# Patient Record
Sex: Female | Born: 1995 | Race: White | Hispanic: No | Marital: Single | Smoking: Former smoker
Health system: Southern US, Community
[De-identification: ages and names within clinical notes are randomized; demographics above are authoritative.]

## PROBLEM LIST (undated history)

## (undated) DIAGNOSIS — F419 Anxiety disorder, unspecified: Secondary | ICD-10-CM

## (undated) DIAGNOSIS — F4310 Post-traumatic stress disorder, unspecified: Secondary | ICD-10-CM

## (undated) DIAGNOSIS — F909 Attention-deficit hyperactivity disorder, unspecified type: Secondary | ICD-10-CM

## (undated) DIAGNOSIS — F319 Bipolar disorder, unspecified: Secondary | ICD-10-CM

## (undated) DIAGNOSIS — F329 Major depressive disorder, single episode, unspecified: Secondary | ICD-10-CM

## (undated) DIAGNOSIS — F32A Depression, unspecified: Secondary | ICD-10-CM

---
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
## 2018-11-25 ENCOUNTER — Inpatient Hospital Stay (HOSPITAL_COMMUNITY)
Admission: AD | Admit: 2018-11-25 | Discharge: 2018-11-26 | Disposition: A | Discharge status: Home / Self Care | Payer: Self-pay | Attending: Obstetrics & Gynecology | Admitting: Obstetrics & Gynecology

## 2018-11-25 ENCOUNTER — Other Ambulatory Visit: Payer: Self-pay

## 2018-11-25 DIAGNOSIS — R1031 Right lower quadrant pain: Secondary | ICD-10-CM | POA: Insufficient documentation

## 2018-11-25 DIAGNOSIS — O99341 Other mental disorders complicating pregnancy, first trimester: Secondary | ICD-10-CM | POA: Insufficient documentation

## 2018-11-25 DIAGNOSIS — O3680X0 Pregnancy with inconclusive fetal viability, not applicable or unspecified: Secondary | ICD-10-CM | POA: Insufficient documentation

## 2018-11-25 DIAGNOSIS — F4310 Post-traumatic stress disorder, unspecified: Secondary | ICD-10-CM | POA: Insufficient documentation

## 2018-11-25 DIAGNOSIS — R1032 Left lower quadrant pain: Secondary | ICD-10-CM | POA: Insufficient documentation

## 2018-11-25 DIAGNOSIS — Z87891 Personal history of nicotine dependence: Secondary | ICD-10-CM | POA: Insufficient documentation

## 2018-11-25 DIAGNOSIS — R109 Unspecified abdominal pain: Secondary | ICD-10-CM | POA: Insufficient documentation

## 2018-11-25 DIAGNOSIS — R1012 Left upper quadrant pain: Secondary | ICD-10-CM | POA: Insufficient documentation

## 2018-11-25 DIAGNOSIS — R1011 Right upper quadrant pain: Secondary | ICD-10-CM | POA: Insufficient documentation

## 2018-11-25 DIAGNOSIS — Z3A01 Less than 8 weeks gestation of pregnancy: Secondary | ICD-10-CM | POA: Insufficient documentation

## 2018-11-25 DIAGNOSIS — O26891 Other specified pregnancy related conditions, first trimester: Secondary | ICD-10-CM | POA: Insufficient documentation

## 2018-11-25 HISTORY — DX: Anxiety disorder, unspecified: F41.9

## 2018-11-25 HISTORY — DX: Post-traumatic stress disorder, unspecified: F43.10

## 2018-11-25 HISTORY — DX: Bipolar disorder, unspecified: F31.9

## 2018-11-25 HISTORY — DX: Depression, unspecified: F32.A

## 2018-11-25 HISTORY — DX: Attention-deficit hyperactivity disorder, unspecified type: F90.9

## 2018-11-25 LAB — URINALYSIS, ROUTINE W REFLEX MICROSCOPIC
Bacteria, UA: NONE SEEN
Bilirubin Urine: NEGATIVE
Glucose, UA: NEGATIVE mg/dL
Hgb urine dipstick: NEGATIVE
Ketones, ur: NEGATIVE mg/dL
Nitrite: NEGATIVE
Protein, ur: NEGATIVE mg/dL
Specific Gravity, Urine: 1.029 (ref 1.005–1.030)
pH: 5 (ref 5.0–8.0)

## 2018-11-25 LAB — POCT PREGNANCY, URINE: Preg Test, Ur: POSITIVE — AB

## 2018-11-25 NOTE — MAU Note (Addendum)
Pt reports abdominal pain and nausea. The abdominal pain began 5/16, it went away, reappeared 5/21 and then has been intermittent until tonight, 11/25/18 at 9:30 pm. Pt reports it began lower abdomen and spread all over. It feels like cramping at the least and a contraction at its worst she states. Currently the pain is lower abdomen, cramping, 3/10. At its worst it was 7/10. The pain resided on its own. Nausea started with the pain. Denies vomiting, bleeding, taking medications (other than prenatal, which was taken this am), visual disturbances.  Pt reports having white, thick discharge that has been consistent "since the beginning of pregnancy" LMP 10/09/18 approximately. Reports fatigue, dizziness was worse when this began, and HA.  HA- is everyday for pt she reports and has stayed the same, dull, posterior head, denies taking any medications for that. Pt reports having sexual intercourse this morning.

## 2018-11-26 ENCOUNTER — Inpatient Hospital Stay (HOSPITAL_COMMUNITY): Payer: Self-pay

## 2018-11-26 ENCOUNTER — Encounter (HOSPITAL_COMMUNITY): Payer: Self-pay | Admitting: Nurse Practitioner

## 2018-11-26 ENCOUNTER — Other Ambulatory Visit: Payer: Self-pay

## 2018-11-26 LAB — COMPREHENSIVE METABOLIC PANEL
ALT: 14 U/L (ref 0–44)
AST: 16 U/L (ref 15–41)
Albumin: 3.8 g/dL (ref 3.5–5.0)
Alkaline Phosphatase: 80 U/L (ref 38–126)
Anion gap: 6 (ref 5–15)
BUN: 11 mg/dL (ref 6–20)
CO2: 24 mmol/L (ref 22–32)
Calcium: 8.8 mg/dL — ABNORMAL LOW (ref 8.9–10.3)
Chloride: 107 mmol/L (ref 98–111)
Creatinine, Ser: 0.63 mg/dL (ref 0.44–1.00)
GFR calc Af Amer: >60 mL/min (ref >60)
GFR calc non Af Amer: >60 mL/min (ref >60)
Glucose, Bld: 90 mg/dL (ref 70–99)
Potassium: 3.8 mmol/L (ref 3.5–5.1)
Sodium: 137 mmol/L (ref 135–145)
Total Bilirubin: 0.5 mg/dL (ref 0.3–1.2)
Total Protein: 6.3 g/dL — ABNORMAL LOW (ref 6.5–8.1)

## 2018-11-26 LAB — WET PREP, GENITAL
Sperm: NONE SEEN
Trich, Wet Prep: NONE SEEN
Yeast Wet Prep HPF POC: NONE SEEN

## 2018-11-26 LAB — CBC
HCT: 36.9 % (ref 36.0–46.0)
Hemoglobin: 12.5 g/dL (ref 12.0–15.0)
MCH: 29.1 pg (ref 26.0–34.0)
MCHC: 33.9 g/dL (ref 30.0–36.0)
MCV: 85.8 fL (ref 80.0–100.0)
Platelets: 271 10*3/uL (ref 150–400)
RBC: 4.3 MIL/uL (ref 3.87–5.11)
RDW: 12 % (ref 11.5–15.5)
WBC: 10.8 10*3/uL — ABNORMAL HIGH (ref 4.0–10.5)
nRBC: 0 % (ref 0.0–0.2)

## 2018-11-26 LAB — ABO/RH: ABO/RH(D): A POS

## 2018-11-26 LAB — HCG, QUANTITATIVE, PREGNANCY: hCG, Beta Chain, Quant, S: 4446 m[IU]/mL — ABNORMAL HIGH (ref <5)

## 2018-11-26 MED ORDER — ONDANSETRON 8 MG PO TBDP: 8.0000 mg | ORAL_TABLET | Freq: Three times a day (TID) | ORAL | 0 refills | Status: AC | PRN

## 2018-11-26 MED ORDER — ONDANSETRON HCL 8 MG PO TABS: 8.0000 mg | ORAL_TABLET | Freq: Three times a day (TID) | ORAL | 0 refills | Status: AC | PRN

## 2018-11-26 MED ORDER — METRONIDAZOLE 500 MG PO TABS: 500.0000 mg | ORAL_TABLET | Freq: Two times a day (BID) | ORAL | 0 refills | Status: AC

## 2018-11-26 NOTE — Discharge Instructions (Signed)
-  come to clinic at [facility], 2nd Floor at 9 am on Wednesday May 27 for a repeat lab drawn  Ectopic Pregnancy  An ectopic pregnancy happens when a fertilized egg grows outside the womb (uterus). The fertilized egg cannot stay alive outside of the womb. This problem often happens in a fallopian tube. It is often caused by damage to the tube. If this problem is found early, you may be treated with medicine that stops the egg from growing. If your tube tears or bursts open (ruptures), you will bleed inside. Often, there is very bad pain in the lower belly. This is an emergency. You will need surgery. Get help right away. Follow these instructions at home: After being treated with medicine or surgery:  Rest and limit your activity for as long as told by your doctor.  Until your doctor says that it is safe: ? Do not lift anything that is heavier than 10 lb (4.5 kg) or the limit that your doctor tells you. ? Avoid exercise and any movement that takes a lot of effort.  To prevent problems when pooping (constipation): ? Eat a healthy diet. This includes:  Fruits.  Vegetables.  Whole grains. ? Drink 6-8 glasses of water a day. Contact a doctor if: Get help right away if:  You have sudden and very bad pain in your belly.  You have very bad pain in your shoulders or neck.  You have pain that gets worse and is not helped by medicine.  You have: ? A fever or chills. ? Vaginal bleeding. ? Redness or swelling at the site of a surgical cut (incision).  You feel sick to your stomach (nauseous) or you throw up (vomit).  You feel dizzy or weak.  You feel light-headed or you pass out (faint). Summary  An ectopic pregnancy happens when a fertilized egg grows outside the womb (uterus).  If this problem is found early, you may be treated with medicine that stops the egg from growing.  If your tube tears or bursts open (ruptures), you will need surgery. This is an emergency. Get help  right away. This information is not intended to replace advice given to you by your health care provider. Make sure you discuss any questions you have with your health care provider. Document Released: 09/16/2008 Document Revised: 07/14/2016 Document Reviewed: 07/14/2016 Elsevier Interactive Patient Education  2019 [employer].

## 2018-11-26 NOTE — MAU Provider Note (Signed)
Patient Betty Hudson is a 23 y.o. G5P1 at 7 weeks by certain LMP, however she has a history of irregular periods.  She denies vomiting, low back pain, VB, abnormal discharge. She says she has felt nauseated. She thinks she had a UTI around the 5th -11 of May; she says that the symptoms went away after taking Azo. She denies symptoms of UTI now.  Patient has a history of constipation; says that she is "always" constipated however she did have a BM today.  History     CSN: [id number]  Arrival date and time: 11/25/18 2310   None     Chief Complaint  Patient presents with  . Abdominal Pain   Abdominal Pain  This is a new problem. The current episode started today. The onset quality is sudden. The pain is located in the suprapubic region. The pain is at a severity of 6/10. The quality of the pain is cramping. The abdominal pain radiates to the RLQ, LUQ, RUQ and LLQ. Pertinent negatives include no constipation, diarrhea, dysuria, nausea or vomiting.  She has tried nothing for the symptoms. Nothing makes the pain better or worse.   OB History    Gravida  4   Para  1   Term      Preterm      AB  2   Living  1     SAB  2   TAB      Ectopic      Multiple      Live Births              Past Medical History:  Diagnosis Date  . ADHD   . Anxiety   . Bipolar 1 disorder (HCC)   . Depression   . PTSD (post-traumatic stress disorder)     History reviewed. No pertinent surgical history.  Family History  Problem Relation Age of Onset  . Heart disease Mother   . Obesity Mother   . Thyroid disease Mother   . Obesity Father   . Obesity Paternal Aunt   . Obesity Paternal Uncle   . Cancer Paternal Grandmother        Breast  . Obesity Paternal Grandmother   . Obesity Paternal Grandfather     Social History   Tobacco Use  . Smoking status: Former Smoker    Last attempt to quit: 11/20/2018    Years since quitting: 0.0  . Smokeless tobacco: Never Used  Substance Use  Topics  . Alcohol use: Never    Frequency: Never  . Drug use: Never    Allergies:  Allergies  Allergen Reactions  . Macrobid [Nitrofurantoin] Hives    No medications prior to admission.    Review of Systems  Constitutional: Negative.   HENT: Negative.   Respiratory: Negative.   Gastrointestinal: Positive for abdominal pain. Negative for constipation, diarrhea, nausea and vomiting.  Genitourinary: Negative for dysuria.  Musculoskeletal: Negative.   Neurological: Negative.   Psychiatric/Behavioral: Negative.    Physical Exam   Blood pressure 110/67, pulse 74, temperature 98.6 F (37 C), temperature source Oral, resp. rate 16, height 5\' 5"  (1.651 m), weight 75.3 kg, last menstrual period 10/09/2018, SpO2 99 %.  Physical Exam  Constitutional: She is oriented to person, place, and time. She appears well-developed and well-nourished.  HENT:  Head: Normocephalic.  Neck: Normal range of motion.  Respiratory: Effort normal.  GI: Soft.  Genitourinary:    Vagina normal.     Genitourinary Comments: NEFG; cervix is  long, closed, no CMT, suprapubic or adnexal tenderness.    Neurological: She is alert and oriented to person, place, and time.  Skin: Skin is warm and dry.  Psychiatric: She has a normal mood and affect.  Abdomen is soft, non-tender, low suspicion for surgical abdomen.   MAU Course  Procedures  MDM -complete ectopic work up performed.  -US shows IUGS but no yolk sac; beta is 4446. Technically pregnancy of unknown location.  -wet prep shows BV; patient now admitting to strong odor and discomfort, especially after intercourse.  -UA normal, no signs of infection.   Assessment and Plan   1. Pregnancy of unknown anatomic location   2. Abdominal pain    2. Try heat, tylenol, rest. Take Flagyl for BV; RX for Zofran tablets and pills given. Patient does not yet have Pregnancy medicaid; she will fill whichever one is cheaper and will use [employer] to cost compare. She  says she will pick up stool softener on her own as she knows that she may have worsening constipation with Zofran.   3. Strict ectopic precautions reviewed; appt made for Wednesday at 9 am for follow up stat [facility]. Lab order placed.   4. Reviewed address of clinic and protocol (wait for results, no visitors, wear a mask).  Charlesetta GaribaldiKathryn Lorraine Kynsli Haapala 11/26/2018, 3:27 AM

## 2018-11-27 LAB — GC/CHLAMYDIA PROBE AMP (~~LOC~~) NOT AT ARMC
Chlamydia: POSITIVE — AB
Neisseria Gonorrhea: NEGATIVE

## 2018-11-28 ENCOUNTER — Other Ambulatory Visit: Payer: Self-pay

## 2018-11-28 ENCOUNTER — Ambulatory Visit (INDEPENDENT_AMBULATORY_CARE_PROVIDER_SITE_OTHER): Payer: Self-pay | Admitting: General Practice

## 2018-11-28 ENCOUNTER — Telehealth: Payer: Self-pay | Admitting: General Practice

## 2018-11-28 DIAGNOSIS — O3680X0 Pregnancy with inconclusive fetal viability, not applicable or unspecified: Secondary | ICD-10-CM

## 2018-11-28 LAB — BETA HCG QUANT (REF LAB): hCG Quant: 5213 m[IU]/mL

## 2018-11-28 NOTE — Progress Notes (Signed)
Patient presents to office today for stat bhcg. Patient reports mild menstrual like cramps, but much less than when she was in MAU. Patient also reports an episode of spotting yesterday. Discussed with patient we are monitoring your bhcg levels today and that I would call her within a couple hours with results/updated plan of care. Patient verbalized understanding, left callback number [phone], and had no questions at this time.   Reviewed results with Dr Shawnie Pons who finds slight increase in bhcg levels but not doubled. Patient should return Friday for repeat bhcg for follow up eval. Will call patient with results.  Chase Caller RN BSN  11/28/18

## 2018-11-28 NOTE — Progress Notes (Signed)
Patient seen and assessed by nursing staff.  Agree with documentation and plan.  

## 2018-11-28 NOTE — Telephone Encounter (Signed)
Called patient with bhcg results and discussed recommended follow up on Friday for repeat bhcg. Ectopic precautions reviewed. Patient verbalized understanding & states she will come Friday at 830. Patient had no questions.

## 2018-11-30 ENCOUNTER — Other Ambulatory Visit: Payer: Self-pay

## 2018-11-30 ENCOUNTER — Ambulatory Visit (INDEPENDENT_AMBULATORY_CARE_PROVIDER_SITE_OTHER): Payer: Self-pay | Admitting: *Deleted

## 2018-11-30 ENCOUNTER — Ambulatory Visit: Payer: Self-pay

## 2018-11-30 DIAGNOSIS — O3680X0 Pregnancy with inconclusive fetal viability, not applicable or unspecified: Secondary | ICD-10-CM

## 2018-11-30 NOTE — Progress Notes (Signed)
Per chart review w/Dr. Alysia Penna, non-stat BHCG done today. Pt was advised she will be notified of lab result via MyChart. She denies having abdominal/pelvic pain or vaginal bleeding. Pt informed of need for follow up US to check progression of pregnancy. [place] for viability scheduled on 6/9 @ 2pm. Pt voiced understanding of all information and instructions given.

## 2018-11-30 NOTE — Progress Notes (Signed)
Agree with A & P. 

## 2018-12-01 LAB — BETA HCG QUANT (REF LAB): hCG Quant: 7101 m[IU]/mL

## 2018-12-11 ENCOUNTER — Ambulatory Visit (HOSPITAL_COMMUNITY)
Admission: RE | Admit: 2018-12-11 | Discharge: 2018-12-11 | Disposition: A | Discharge status: Home / Self Care | Payer: Self-pay | Source: Ambulatory Visit | Attending: Obstetrics & Gynecology | Admitting: Obstetrics & Gynecology

## 2018-12-11 ENCOUNTER — Other Ambulatory Visit: Payer: Self-pay

## 2018-12-11 DIAGNOSIS — O3680X0 Pregnancy with inconclusive fetal viability, not applicable or unspecified: Secondary | ICD-10-CM | POA: Insufficient documentation

## 2018-12-12 ENCOUNTER — Telehealth: Payer: Self-pay

## 2018-12-12 NOTE — Telephone Encounter (Addendum)
-----   Message from Chancy Milroy, MD sent at 12/12/2018  8:27 AM EDT ----- Please let pt know U/S confirmed IUP with cardiac activity. Start PNV and new OB appt in 4-5 weeks Thanks Legrand Como  Notified pt provider's recommendation and that she can get the proof of pregnancy letter from her MyChart to send to her Medicaid case worker. I informed pt that she would usually start OB care around 12 weeks however she needed to contact an OB office now.  Pt stated understanding.

## 2019-10-14 IMAGING — US OBSTETRIC <14 WK ULTRASOUND
1 series · 15 of 27 positions shown · non-contrast
Comparison: None available.

CLINICAL DATA: Initial evaluation for acute abdominal pain in early
pregnancy.

EXAM:
OBSTETRIC <14 WK US AND TRANSVAGINAL OB US
TECHNIQUE: Both transabdominal and transvaginal ultrasound examinations were
performed for complete evaluation of the gestation as well as the
maternal uterus, adnexal regions, and pelvic cul-de-sac.
Transvaginal technique was performed to assess early pregnancy.

[Series 1: obstetric <14 wk ultrasound · 27 acquisitions, 15 frames shown]
[im 1/27]
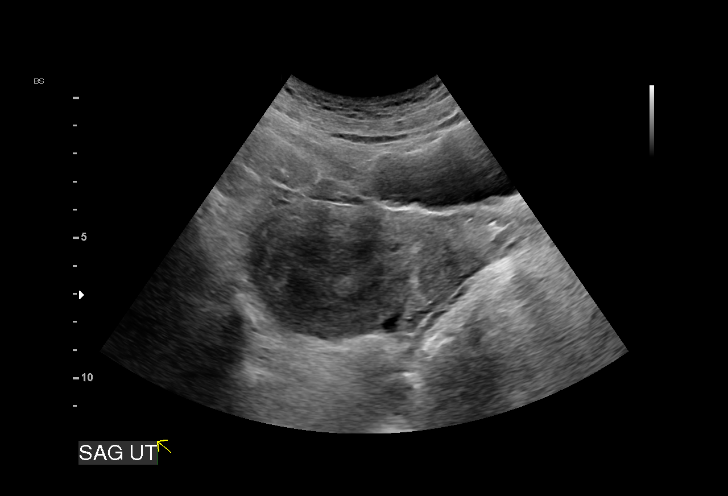
[im 3/27]
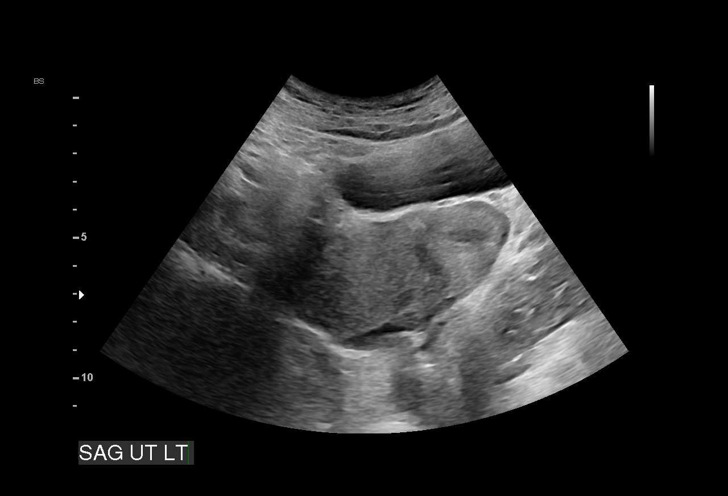
[im 5/27]
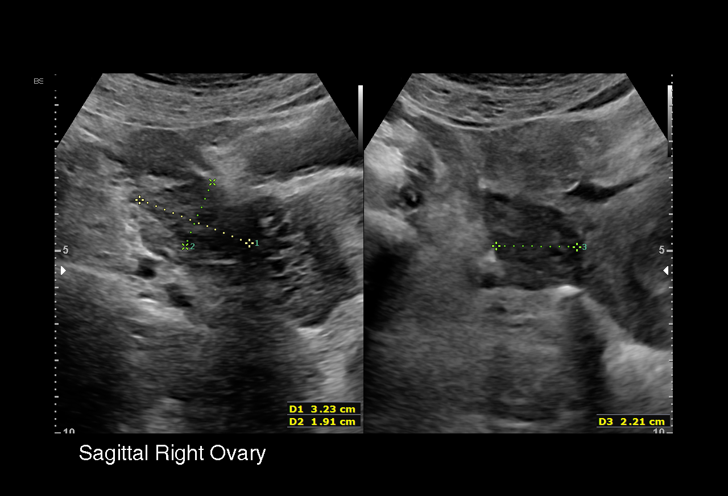
[im 7/27]
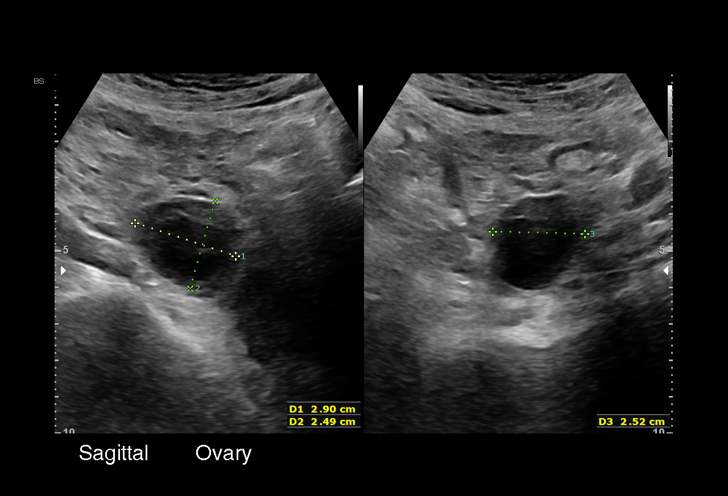
[im 9/27]
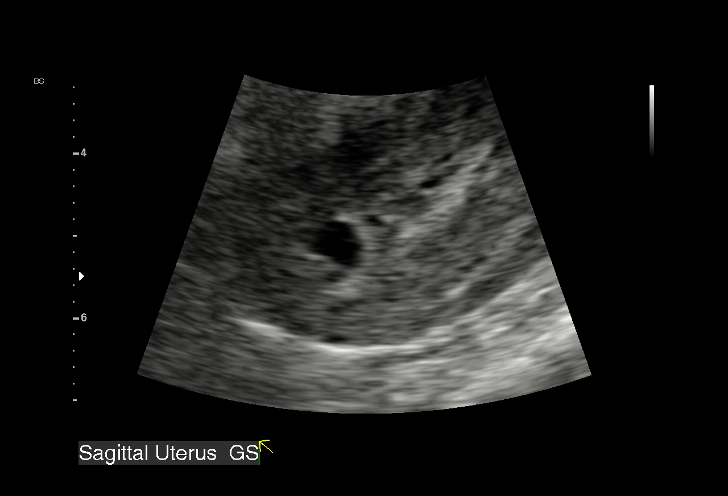
[im 10/27]
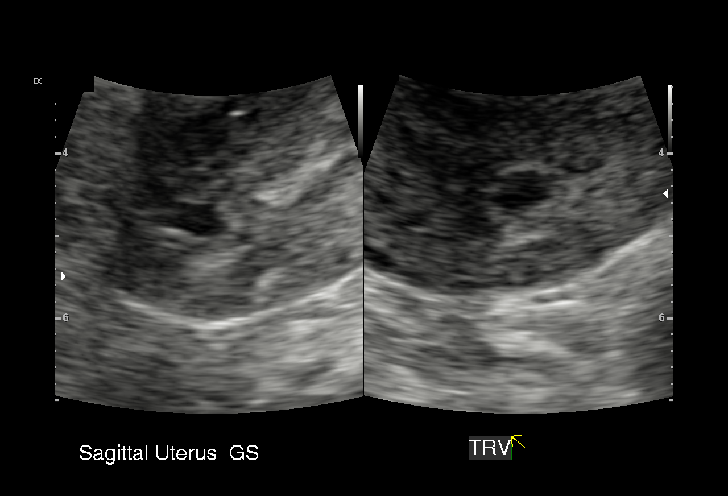
[im 12/27]
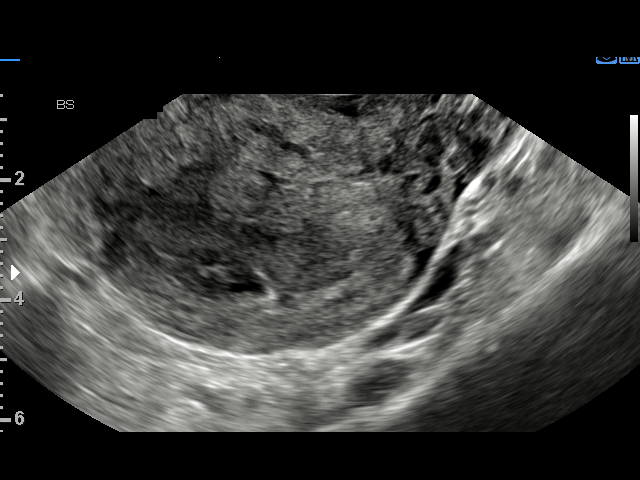
[im 14/27]
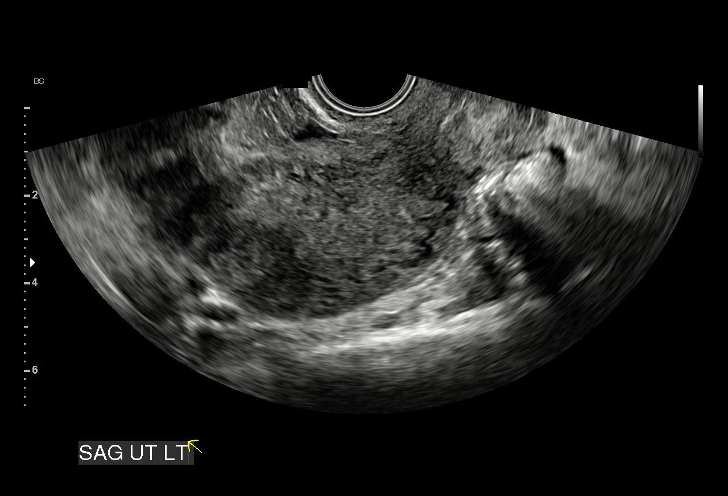
[im 16/27]
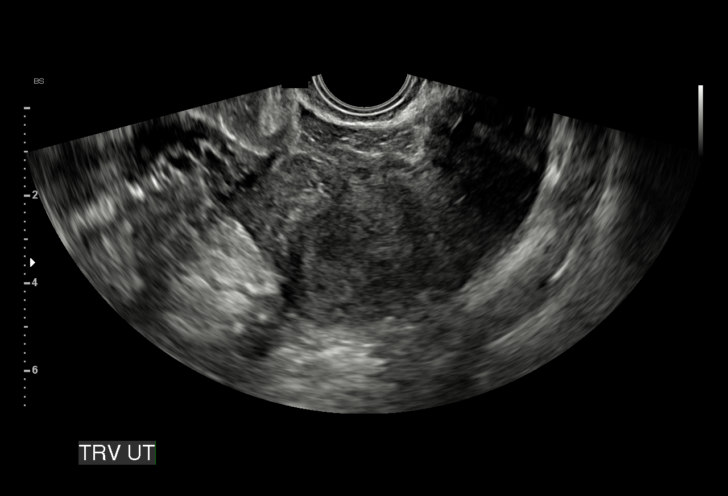
[im 18/27]
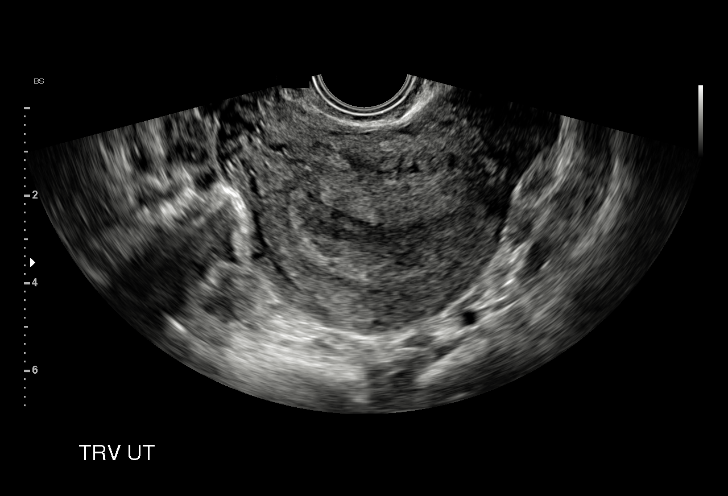
[im 19/27]
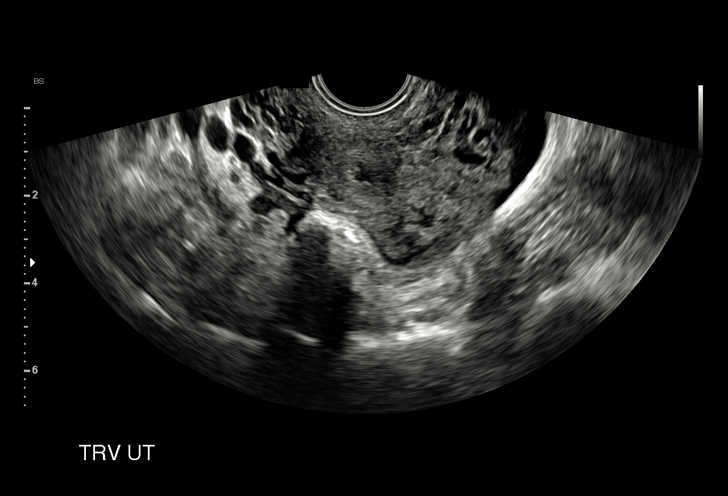
[im 21/27]
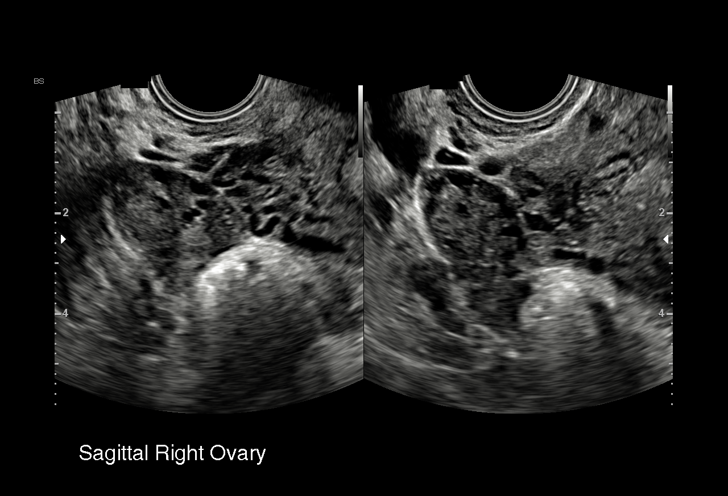
[im 23/27]
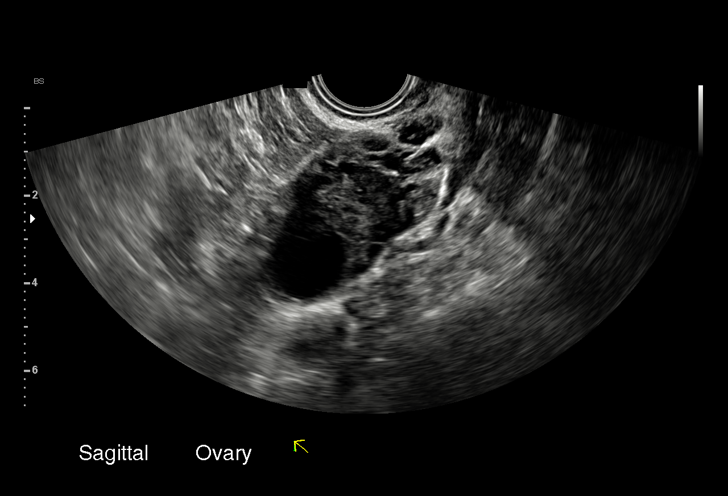
[im 25/27]
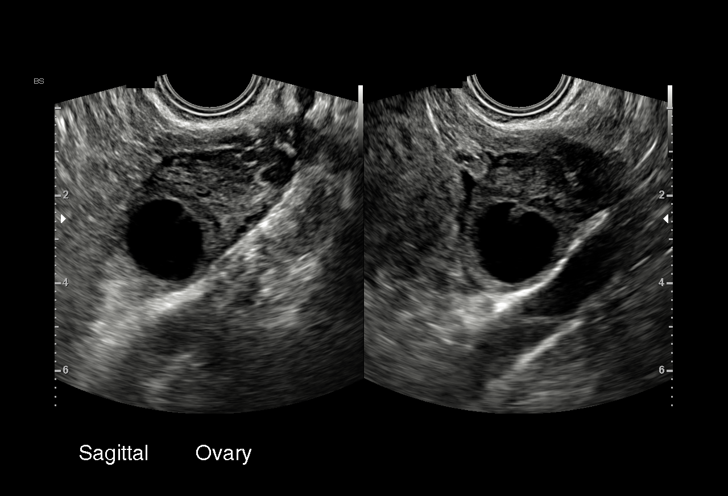
[im 27/27]
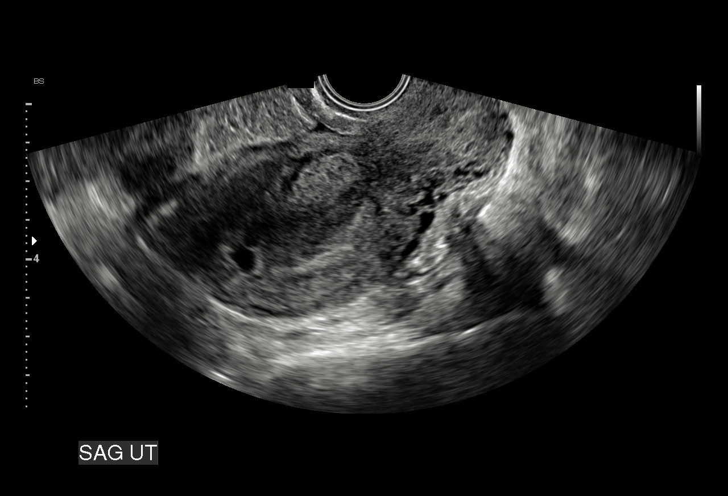

[15 of 27 positions shown; findings below may reference images not displayed]

FINDINGS: Intrauterine gestational sac: Single

Yolk sac:  Not seen.

Embryo:  Negative.

Cardiac Activity: N/A .

Heart Rate: N/A  bpm

MSD: 5.2 mm   5 w   2 d

Subchorionic hemorrhage:  None visualized.

Maternal uterus/adnexae: Ovaries normal in appearance bilaterally.
Small corpus luteal cyst noted on the left. Small volume free fluid
within the pelvis.
IMPRESSION: 1. Probable early intrauterine gestational sac, but no yolk sac,
fetal pole, or cardiac activity yet visualized. Recommend follow-up
quantitative B-HCG levels and follow-up US in 14 days to confirm and
assess viability. This recommendation follows SRU consensus
guidelines: Diagnostic Criteria for Nonviable Pregnancy Early in the
First Trimester. N Engl J Med 9302; [DATE].
2. No other acute maternal uterine or adnexal abnormality
identified.

## 2019-10-29 IMAGING — US TRANSVAGINAL OB ULTRASOUND
1 series · 15 of 28 positions shown · non-contrast
Comparison: 11/26/2018

CLINICAL DATA: First trimester pregnancy with inconclusive fetal
viability.

EXAM:
TRANSVAGINAL OB ULTRASOUND
TECHNIQUE: Transvaginal ultrasound was performed for complete evaluation of the
gestation as well as the maternal uterus, adnexal regions, and
pelvic cul-de-sac.

[Series 1: transvaginal ob ultrasound · 15 of 47 slices shown]
[im 1/47]
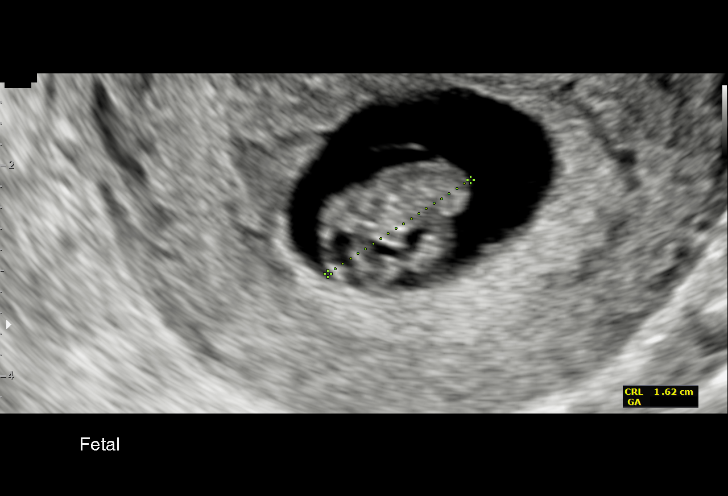
[im 4/47]
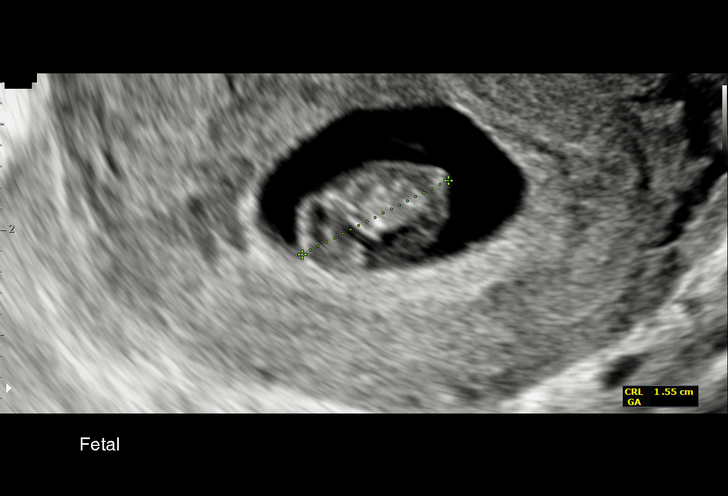
[im 7/47]
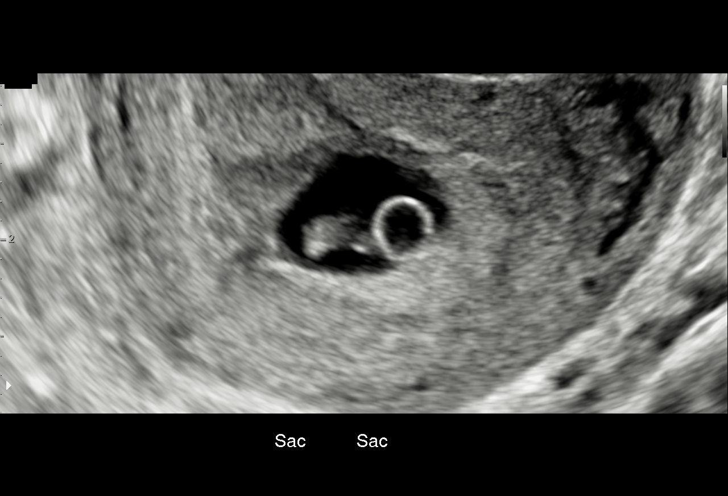
[im 11/47]
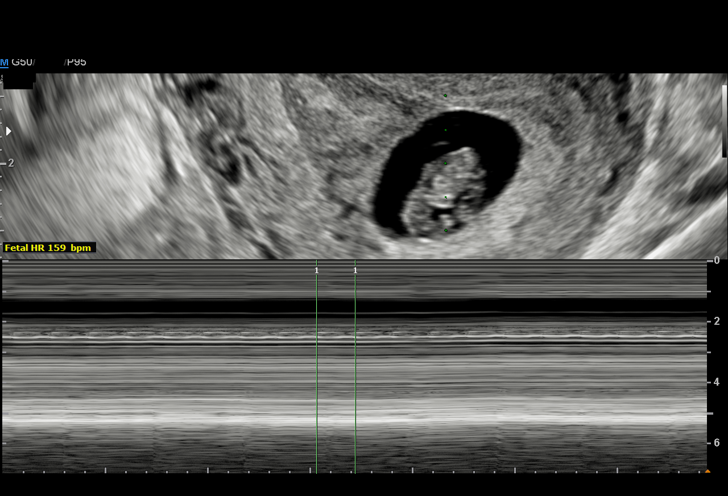
[im 14/47]
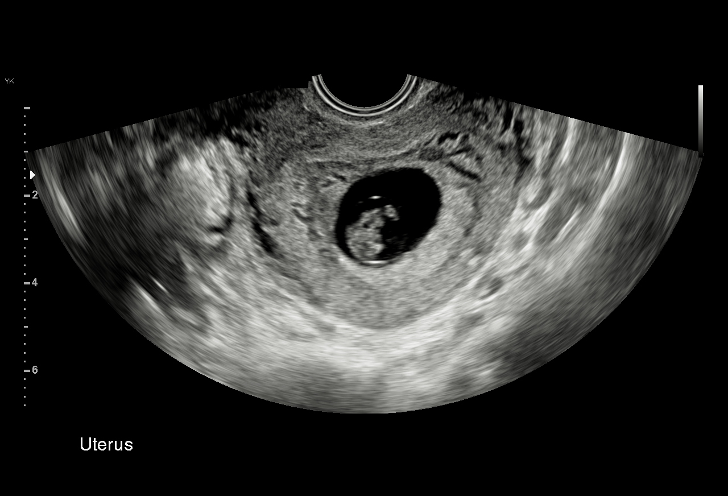
[im 18/47]
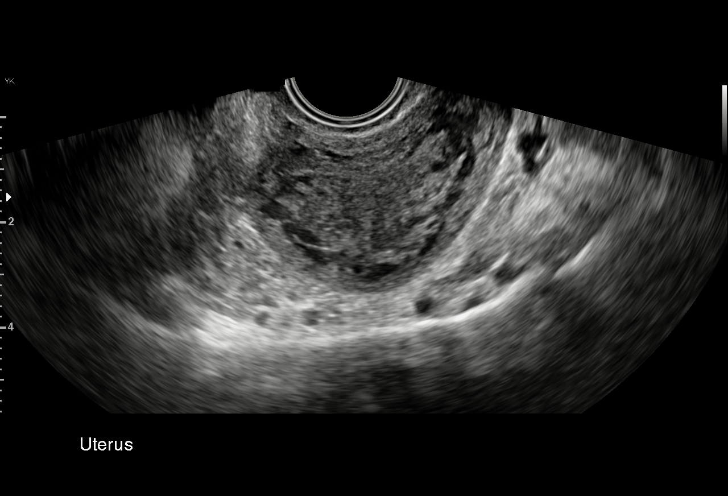
[im 21/47]
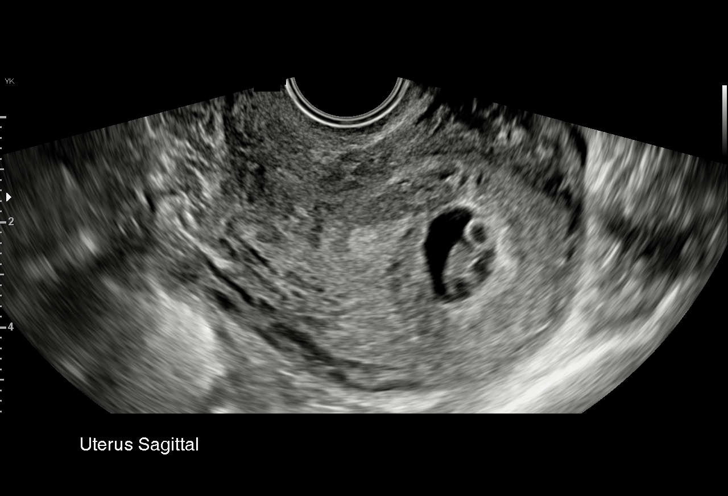
[im 24/47]
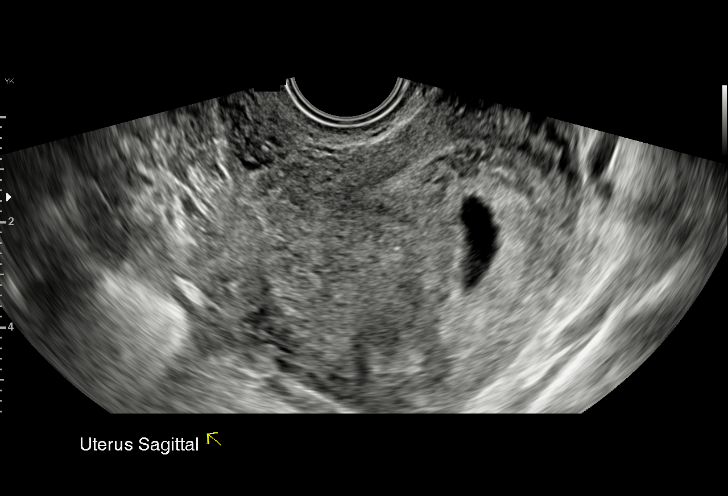
[im 26/47]
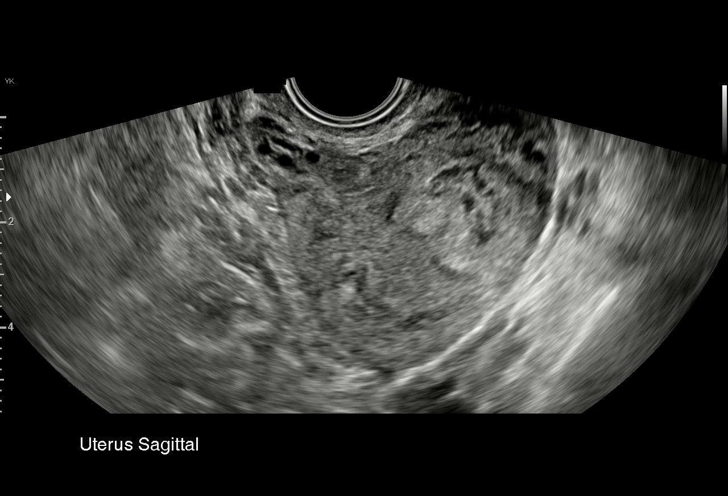
[im 29/47]
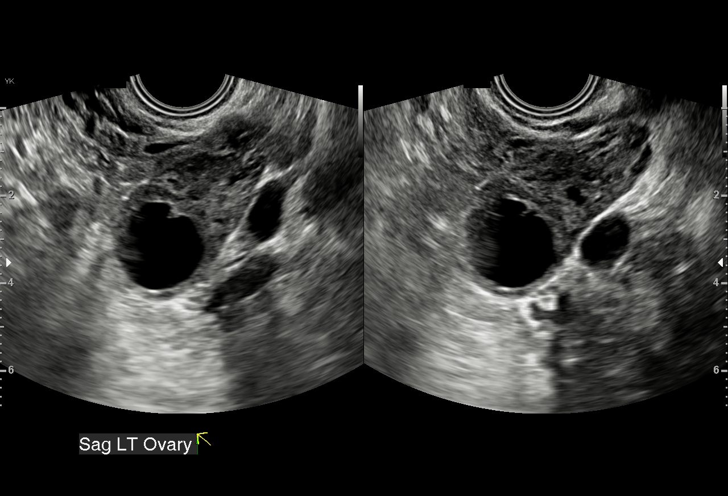
[im 33/47]
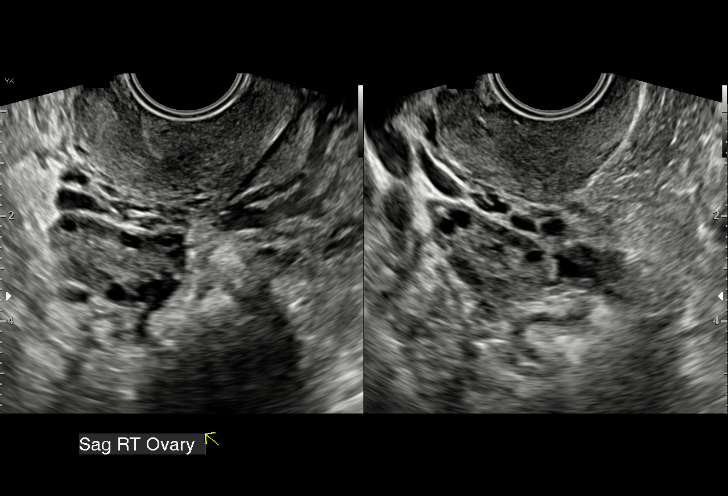
[im 36/47]
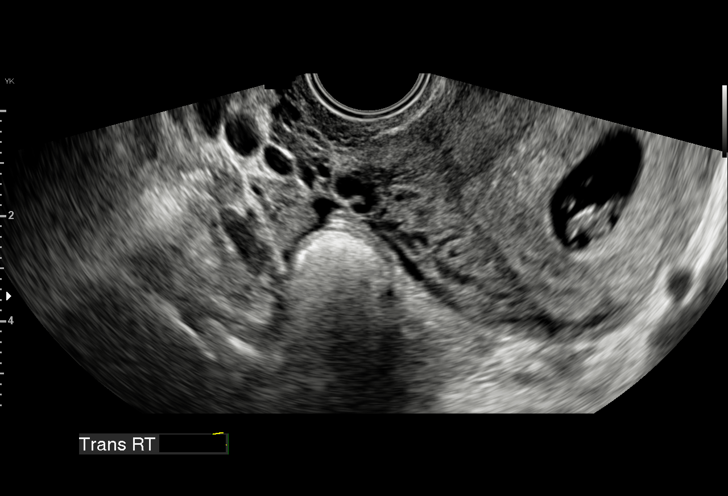
[im 40/47]
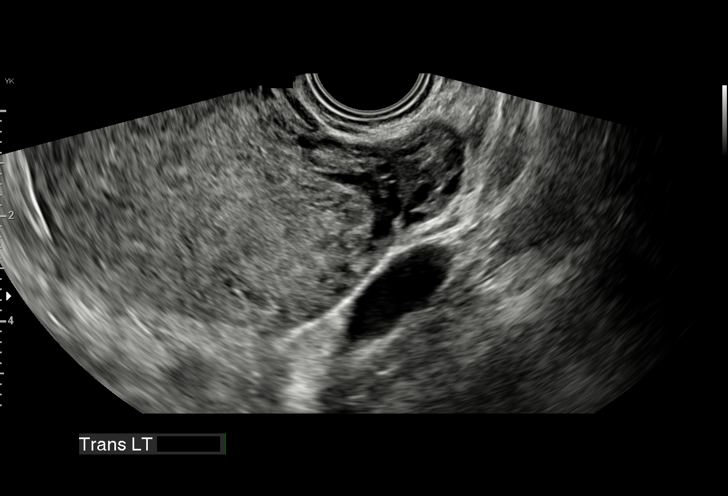
[im 43/47]
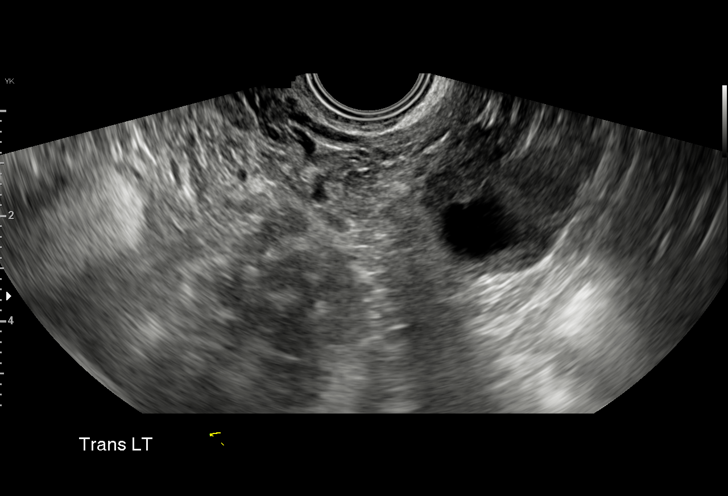
[im 47/47]
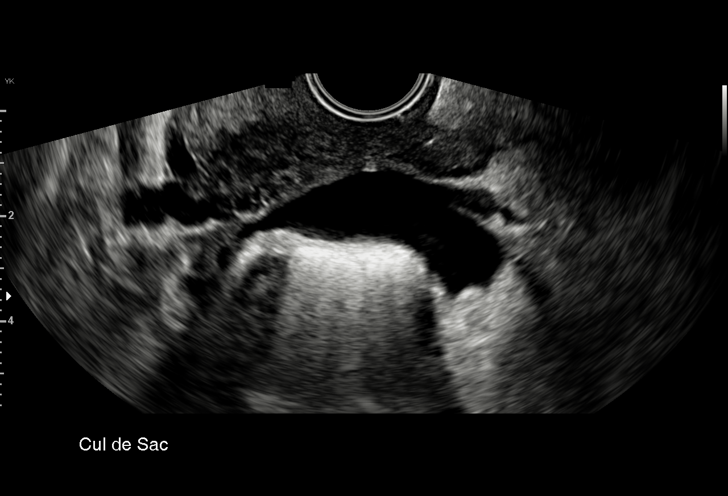

[15 of 28 positions shown; findings below may reference images not displayed]

FINDINGS: Intrauterine gestational sac: Single

Yolk sac:  Visualized.

Embryo:  Visualized.

Cardiac Activity: Visualized.

Heart Rate: 161 bpm

CRL:   16 mm   7 w 6 d                  US EDC: 07/25/2019

Subchorionic hemorrhage:  None visualized.

Maternal uterus/adnexae: Small left ovarian corpus luteum cyst.
Normal appearance of right ovary. No adnexal mass identified. Tiny
amount of simple free fluid noted in cul-de-sac.
IMPRESSION: Single living IUP measuring 7 weeks 6 days, with US EDC of
07/25/2019.

No significant maternal uterine or adnexal abnormality identified.
